# Patient Record
Sex: Male | Born: 1975 | Race: White | Hispanic: No | Marital: Married | Smoking: Current every day smoker
Health system: Southern US, Community
[De-identification: ages and names within clinical notes are randomized; demographics above are authoritative.]

## PROBLEM LIST (undated history)

## (undated) HISTORY — PX: BACK SURGERY: SHX140

---

## 2001-05-29 ENCOUNTER — Encounter: Payer: Self-pay | Admitting: Emergency Medicine

## 2001-05-29 ENCOUNTER — Emergency Department (HOSPITAL_COMMUNITY): Admission: EM | Admit: 2001-05-29 | Discharge: 2001-05-29 | Payer: Self-pay | Admitting: Emergency Medicine

## 2001-10-05 ENCOUNTER — Encounter: Payer: Self-pay | Admitting: Family Medicine

## 2001-10-05 ENCOUNTER — Encounter: Admission: RE | Admit: 2001-10-05 | Discharge: 2001-10-05 | Payer: Self-pay | Admitting: Family Medicine

## 2005-01-06 ENCOUNTER — Ambulatory Visit (HOSPITAL_COMMUNITY): Admission: RE | Admit: 2005-01-06 | Discharge: 2005-01-06 | Payer: Self-pay | Admitting: Orthopaedic Surgery

## 2007-12-05 ENCOUNTER — Ambulatory Visit (HOSPITAL_COMMUNITY): Admission: RE | Admit: 2007-12-05 | Discharge: 2007-12-06 | Payer: Self-pay | Admitting: Neurosurgery

## 2011-04-12 NOTE — Op Note (Signed)
NAMENORIS, KULINSKI NO.:  [id number]   MEDICAL RECORD NO.:  [id number]          PATIENT TYPE:  OIB   LOCATION:  3535                         FACILITY:  [facility]   PHYSICIAN:  Cristi Loron, M.D.DATE OF BIRTH:  09/23/1976   DATE OF PROCEDURE:  12/05/2007  DATE OF DISCHARGE:                               OPERATIVE REPORT   BRIEF HISTORY:  The patient is a 35 year old white male who has suffered  a prior left L5-S1 herniated disc after a work-related injury.  The  patient underwent a left L5-S1 microdiscectomy by another physician.  The patient has had recurrent back and leg pain and was worked up with a  lumbar MRI which demonstrated herniated disc at L5-S1 on the left.  The  patient's signs, symptoms, and physical exam are consistent with left S1  radiculopathy.  I discussed the various treatment options with the  patient including surgery.  He has weighed the risks, benefits, and  alternatives of surgery and decided to proceed with a left L5-S1 redo  microdiscectomy.   PREOPERATIVE DIAGNOSES:  Recurrent left L5-S1 herniated nucleus  pulposus, spinal stenosis, disc degeneration, lumbar radiculopathy, and  lumbago.   POSTOPERATIVE DIAGNOSES:  Recurrent left L5-S1 herniated nucleus  pulposus, spinal stenosis, disc degeneration, lumbar radiculopathy, and  lumbago.   PROCEDURE:  Redo left L5-S1 microdiscectomy using microdissection.   SURGEON:  Cristi Loron, MD   ASSISTANT:  Coletta Memos, MD   ANESTHESIA:  General endotracheal.   ESTIMATED BLOOD LOSS:  50 mL.   SPECIMENS:  None.   DRAINS:  None.   COMPLICATIONS:  None.   DESCRIPTION OF PROCEDURE:  The patient was brought to the operating room  by the anesthesia team.  General endotracheal anesthesia was induced.  The patient was then turned to the prone position on the Wilson frame.  His lumbosacral region was then prepared with Betadine scrub and  Betadine solution.  Sterile drapes were  applied.  I then injected the  area to be incised with Marcaine with epinephrine solution, and I used a  scalpel to make a linear midline incision through the patient's previous  surgical scar.  Of note, the incision was somewhat skewed, but I went  through the previous scar nonetheless.  I used electrocautery to perform  a left-sided subperiosteal dissection exposing the left spinous process  and lamina of L4-L5 and the upper sacrum.  We obtained intraoperative  radiograph to confirm our location and then inserted a McCullough  retractor for exposure and used a high-speed drill to drill through some  epidural fibrosis and to expose the remainder left L5 lamina.  I  extended the patient's prior left L5 laminotomy in a cephalad direction  until I encountered some relatively non-scarred dura.  I then used  microdissection to free up the dura from the overlying epidural tissue  as well as epidural fibrosis.  I then performed foraminotomy about the  left thecal sac and left S1 nerve root.  I removed the cephalad aspect  of the left S1 lamina as well to further decompress the nerve.  We used  microdissection to free up the thecal sac and the left S1 nerve root  from the underlying disc herniation.  I then incised the disc herniation  at L5-S1 on the left with a #15-blade scalpel, removed the disc  herniation and performed a partial intervertebral discectomy using the  Epstein and Scoville curettes as well as the pituitary forceps.  After  we were satisfied with the intervertebral discectomy, I used a high-  speed drill to remove some redundant ligament and spondylosis from the  vertebral endplates at L5-S1 further decompressing the neural  structures.  We then obtained hemostasis using bipolar electrocautery.  I palpated along the ventral surface of the thecal sac and along the  exit route of the left S1 nerve root and noted the neural structures  were well decompressed.  We irrigated the wound  out with bacitracin  solution, removed the retractor and then reapproximated the patient's  thoracolumbar fascia with interrupted #1 Vicryl suture, subcutaneous  tissue with interrupted 2-0 Vicryl suture, and the skin with Steri-  Strips and Benzoin.  The wound was then coated with bacitracin ointment  and sterile dressing was applied.  The drapes were removed.  The patient  was subsequently returned to the supine position where he was extubated  by the anesthesia team and transported to the post-anesthesia care unit  in a stable condition.  All sponge, instrument, and needle counts were  correct at the end of this case.      Cristi Loron, M.D.  Electronically Signed     JDJ/MEDQ  D:  12/05/2007  T:  12/06/2007  Job:  [id number]

## 2011-04-15 NOTE — H&P (Signed)
NAME:  Jeremy Contreras, Jeremy Contreras NO.:  [id number]   MEDICAL RECORD NO.:  [id number]          PATIENT TYPE:  OIB   LOCATION:  NA                           FACILITY:  [facility]   PHYSICIAN:  Sharolyn Douglas, M.D.        DATE OF BIRTH:  [birth year]   DATE OF ADMISSION:  DATE OF DISCHARGE:                                HISTORY & PHYSICAL   CHIEF COMPLAINT:  Pain in my back with weakness and numbness in my left leg  and foot.   HISTORY OF PRESENT ILLNESS:  This 35 year old male was injured when he was  climbing out of a hole.  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  He was treated at urgent care for a hamstring pull.  Eventually  he was referred to Dr. Candise Bowens office and MRI showed a large ruptured disk  at L5-S1.  The patient has continued with pain and discomfort in the left  buttock and thigh and calf and numbness as well.  Coughing, sneezing  markedly increases his pain and discomfort.  He has some relief of pain with  analgesics and muscle relaxants.  He has considerable amount of night  cramping into his leg.  He has had conservative treatment including anti-  inflammatories, pain medications, crutches, muscle relaxants, __________  therapy, which really has not helped his overall discomfort.  Due to the  findings on MRI and his failure to respond conservative treatment, it is  felt he would benefit from surgical intervention, being admitted for L5-S1  micro endoscopic diskectomy.  Dr. Noel Gerold has discussed the risks and benefits  of surgery.   PAST MEDICAL HISTORY:  The patient's family physician is Dr. Zenda Alpers at  [facility].   CURRENT MEDICATIONS:  Ibuprofen (will stop five days prior to surgery) and  Flexeril 10 mg q.8h. for muscle spasm.   ALLERGIES:  He has no medical allergies.   PAST SURGICAL HISTORY:  He has no previous surgeries and denies any medical  problems.   SOCIAL HISTORY:  The patient is single, engaged.  He is  an irrigation  foreman.  Smokes about a half pack of cigarettes per day.  No intake of  alcohol products.  Has one child and his fiancee will be caregiver after  surgery.   FAMILY HISTORY:  Noncontributory.   REVIEW OF SYSTEMS:  CNS:  No seizures, shoulder paralysis, or double vision.  The patient does have weakness and dorsiflexion of right left foot and  hallux with sensory loss in the lower extremity in the L5 nerve root  distribution.  CARDIOVASCULAR:  No chest pain.  No angina.  No orthopnea.  GASTROINTESTINAL:  No nausea, vomiting, melena, bloody stools.  GENITOURINARY:  No discharge, dysuria, hematuria.  MUSCULOSKELETAL:  Primarily in present illness.   PHYSICAL EXAMINATION:  GENERAL:  Alert, cooperative, friendly 35 year old  male whose vital signs are blood pressure 144/70, pulse 88, respirations 12.  HEENT:  Normocephalic.  PERRLA.  EOM intact.  Oropharynx is clear.  CHEST:  Clear to auscultation.  No rhonchi.  No rales.  No wheezes.  HEART:  Regular rate and rhythm.  No murmurs are heard.  ABDOMEN:  Soft, nontender.  Liver, spleen not felt.  GENITALIA:  Not done.  Not pertinent to present illness.  RECTAL:  Not done.  Not pertinent to present illness.  EXTREMITIES:  As in present illness above with positive straight leg raise  on the left and weakness and numbness.   ADMITTING DIAGNOSES:  Herniated nucleus pulposus L5-S1.   PLAN:  The patient will undergo L5-S1 micro endoscopic diskectomy.  This  history and physical was performed in our office on December 31, 2004.  Today  the patient is fitted with a lumbosacral orthosis which will be used  postoperatively.      DLU/MEDQ  D:  12/31/2004  T:  12/31/2004  Job:  [id number]   cc:   Zenda Alpers, M.D.  [facility]

## 2011-04-15 NOTE — Op Note (Signed)
NAMEKARIN, GRIFFITH NO.:  [id number]   MEDICAL RECORD NO.:  [id number]          PATIENT TYPE:  OIB   LOCATION:  2853                         FACILITY:  [facility]   PHYSICIAN:  Sharolyn Douglas, M.D.        DATE OF BIRTH:  Jun 17, 1976   DATE OF PROCEDURE:  01/06/2005  DATE OF DISCHARGE:                                 OPERATIVE REPORT   DIAGNOSIS:  L5-S1 disk herniation with left S1 radiculopathy.   PROCEDURE:  L5-S1 micro endoscopic diskectomy using the Max S retractor  system.   SURGEON:  Sharolyn Douglas, M.D.   ASSISTANT:  Verlin Fester, P.A.   ANESTHESIA:  General endotracheal.   COMPLICATIONS:  None.   INDICATIONS:  The patient is a pleasant 35 year old male with a large disk  rupture at L5-S1 and left S1 radiculopathy. He has elected to undergo L5-S1  micro endoscopic diskectomy in hopes of improving his symptoms. He knows the  risks and benefits.   PROCEDURE:  The patient was properly identified in the holding area, taken  to the operating room, underwent general endotracheal anesthesia without  difficulty. Given prophylactic IV antibiotics. Carefully turned prone onto  the Wilson frame. All bony prominences padded. Face and eyes protected at  all times. Back prepped, draped in the usual sterile fashion. Fluoroscopy  was brought into the field. The L5-S1 level was identified. A 2 cm incision  was made just the left of midline. Deep fascia incised. Dilators were then  used to spread the paraspinal muscle docking on the L5-S1 interspace using  fluoroscopy. We then placed the Max S retractor with a 60 mm blades and  expanded the retractor. We then attached the Max S retractor to the  operating room table using the attachment arm. Fluoroscopy confirmed good  positioning. We then removed a small thin layer of paraspinal muscle over  the L5-S1 interspace using pituitaries. The microscope was draped and  brought into the field. The inferior one third of the L5 lamina  removed with  a high-speed bur. Ligamentum flavum removed piecemeal. We immediately noted  that the S1 nerve root was being displaced dorsally by large disk rupture.  The nerve root was mobilized medially. The annulus was incised and the disk  rupture removed using a pituitary rongeur. We then entered the disk space  and removed several more small fragments. The spinal canal was explored down  medial to the S1 pedicle using a blunt probe and we did not appreciate any  additional fragments. The wound was irrigated. 2 mL of fentanyl left over  the exposed  epidural space. The Max S retractor was removed and the deep fascia closed  with an O Vicryl followed by 2-0 Vicryl in subcutaneous layer and a layer of  Dermabond on the epidermis. The patient was then turned supine, extubated  without difficulty and transferred to recovery in stable condition.      MC/MEDQ  D:  01/06/2005  T:  01/06/2005  Job:  [id number]

## 2011-04-21 ENCOUNTER — Emergency Department (HOSPITAL_BASED_OUTPATIENT_CLINIC_OR_DEPARTMENT_OTHER)
Admission: EM | Admit: 2011-04-21 | Discharge: 2011-04-22 | Disposition: A | Discharge status: Home / Self Care | Payer: Self-pay | Attending: Emergency Medicine | Admitting: Emergency Medicine

## 2011-04-21 ENCOUNTER — Emergency Department (INDEPENDENT_AMBULATORY_CARE_PROVIDER_SITE_OTHER): Payer: Self-pay

## 2011-04-21 DIAGNOSIS — W208XXA Other cause of strike by thrown, projected or falling object, initial encounter: Secondary | ICD-10-CM | POA: Insufficient documentation

## 2011-04-21 DIAGNOSIS — S9030XA Contusion of unspecified foot, initial encounter: Secondary | ICD-10-CM | POA: Insufficient documentation

## 2011-04-21 DIAGNOSIS — F17200 Nicotine dependence, unspecified, uncomplicated: Secondary | ICD-10-CM | POA: Insufficient documentation

## 2011-04-21 DIAGNOSIS — G8929 Other chronic pain: Secondary | ICD-10-CM | POA: Insufficient documentation

## 2011-08-17 LAB — BASIC METABOLIC PANEL
BUN: 7
CO2: 25
Calcium: 9.6
Chloride: 106
Creatinine, Ser: 0.87
GFR calc Af Amer: >60
GFR calc non Af Amer: >60
Glucose, Bld: 104 — ABNORMAL HIGH
Potassium: 4.3
Sodium: 137

## 2011-08-17 LAB — CBC
MCHC: 35.1
MCV: 88.6
RBC: 4.99
RDW: 13

## 2013-05-30 ENCOUNTER — Ambulatory Visit (INDEPENDENT_AMBULATORY_CARE_PROVIDER_SITE_OTHER): Payer: BC Managed Care – PPO | Admitting: Physician Assistant

## 2013-05-30 ENCOUNTER — Ambulatory Visit (INDEPENDENT_AMBULATORY_CARE_PROVIDER_SITE_OTHER): Payer: BC Managed Care – PPO

## 2013-05-30 ENCOUNTER — Encounter: Payer: Self-pay | Admitting: Physician Assistant

## 2013-05-30 VITALS — BP 129/76 | HR 59 | Temp 98.9°F | Ht 69.0 in | Wt 190.0 lb

## 2013-05-30 DIAGNOSIS — M549 Dorsalgia, unspecified: Secondary | ICD-10-CM

## 2013-05-30 DIAGNOSIS — G8929 Other chronic pain: Secondary | ICD-10-CM | POA: Insufficient documentation

## 2013-05-30 DIAGNOSIS — M25561 Pain in right knee: Secondary | ICD-10-CM

## 2013-05-30 DIAGNOSIS — M25569 Pain in unspecified knee: Secondary | ICD-10-CM

## 2013-05-30 DIAGNOSIS — B019 Varicella without complication: Secondary | ICD-10-CM

## 2013-05-30 MED ORDER — VALACYCLOVIR HCL 1 G PO TABS: 1000.0000 mg | ORAL_TABLET | Freq: Three times a day (TID) | ORAL | Status: DC

## 2013-05-30 NOTE — Patient Instructions (Signed)
Chickenpox in Adults Chickenpox is an illness caused by a virus. This virus can spread easily from one person to another. Those with chickenpox almost never get it more than once. While it usually strikes children, adults who have never had the illness or vaccine can get chickenpox. In children, the illness is reasonably mild, although annoying. In adults, it can be very serious. CAUSES  A varicella-zoster virus causes chickenpox. The virus is passed in tiny droplets that the infected person coughs or sneezes into the air. Chickenpox can also be passed when someone comes into contact with the fluid produced by the chickenpox rash. When someone has been exposed to chickenpox, he or she usually comes down with the illness within about 10 to 21 days.  SYMPTOMS  The illness usually starts with:  Body aches and pain.  Headache.  Irritability.  Tiredness.  Fever.  Sore throat. A day or two later, a rash develops. The rash is made up of very itchy blisters. The rash lasts about 5 to 7 days. Each "chicken pock" heals over with a crusty scab. Chickenpox is very serious illness in adults. There is a higher risk of complications, including:  Pneumonia.  Skin infection.  Bone infection (osteomyelitis).  Joint infection (septic arthritis).  Brain infection (encephalitis).  Toxic shock syndrome.  Bleeding problems.  Problems with balance and muscle control (cerebellar ataxia).  Death. Women who get chickenpox during pregnancy have a higher risk of having a baby with birth defects. DIAGNOSIS  A diagnosis is based on the presence of the usual symptoms of achiness and fever along with the characteristic rash. If there is any question, blood tests can be done to diagnose the infection. TREATMENT  Treatment for chickenpox may include:  Taking the anti-viral drug acyclovir to shorten the length of the illness and to decrease its severity. The drug has to be started within 24 hours of  symptoms.  Taking medicine as directed by your caregiver.  Applying calamine lotion to decrease itchiness.  Baking soda or oatmeal baths to soothe itchy skin. When a person who has never had chickenpox has been exposed to the virus (especially a pregnant woman or a patient with HIV or AIDS) they might benefit from a shot of varicella-zoster immune globulin (VZIG). This helps prevent the person from actually coming down with the illness. VZIG must be given within 72 hours of exposure to the virus. HOME CARE INSTRUCTIONS   Only take over-the-counter or prescriptions medicines for pain, discomfort, or fever as directed by your caregiver.  Try taking a lukewarm (not hot) bath every few hours. Adding several tablespoons of baking soda or oatmeal may help make the bath more soothing.  Ice packs or cold washcloths applied to the rash may help improve itching.  Ask your caregiver if you may use an over-the-counter antihistamine (such as diphenhydramine) to decrease itching.  Wash your hands often. This helps lower the risk of a bacterial skin infection, as well as passing the virus to others. If you can, use alcohol-based rubs or wipes. If you cannot get these, use regular soap and water.  If you have blisters in your mouth, do not eat or drink spicy, salty, or acidic things. Soft, bland, cold foods and beverages will feel best.  Avoid people who have not had chickenpox or women who are pregnant. SEEK IMMEDIATE MEDICAL CARE IF:   You have a hard time breathing.  You have a severe headache.  You have a stiff neck.  You have severe   joint pain or stiffness.  You feel disoriented or confused.  You are having trouble walking or keeping your balance.  You have an oral temperature above 102 F (38.9 C).  The area around one of the chickenpox becomes very red, hot to the touch, painful, or leaks pus. Document Released: 08/23/2008 Document Revised: 02/06/2012 Document Reviewed:  08/23/2008 [employer] Patient Information 2014 [employer].  

## 2013-05-30 NOTE — Progress Notes (Signed)
Subjective:     Patient ID: Jeremy Contreras, male   DOB: 02-03-1976, 37 y.o.   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 pt and Pregnant women RX for patellar stab. brace F/U prn

## 2014-04-12 ENCOUNTER — Ambulatory Visit: Payer: BC Managed Care – PPO | Admitting: Nurse Practitioner

## 2014-04-12 ENCOUNTER — Encounter: Payer: Self-pay | Admitting: Nurse Practitioner

## 2014-04-12 VITALS — BP 141/81 | HR 78 | Temp 97.5°F | Ht 69.0 in | Wt 185.0 lb

## 2014-04-12 DIAGNOSIS — A5131 Condyloma latum: Secondary | ICD-10-CM

## 2014-04-12 DIAGNOSIS — A5139 Other secondary syphilis of skin: Secondary | ICD-10-CM

## 2014-04-12 MED ORDER — PODOFILOX 0.5 % EX GEL: Freq: Two times a day (BID) | CUTANEOUS | Status: DC

## 2014-04-12 NOTE — Patient Instructions (Signed)
Podofilox topical gel What is this medicine? PODOFILOX (po do FIL ox) is used to remove genital or perianal warts. This medicine may be used for other purposes; ask your health care provider or pharmacist if you have questions. COMMON BRAND NAME(S): Condylox What should I tell my health care provider before I take this medicine? They need to know if you have any of these conditions: -an unusual or allergic reaction to podofilox, podophyllum resin, other medicines, foods, dyes or preservatives -pregnant or trying to get pregnant -breast-feeding How should I use this medicine? This medicine is for external use only. Do not take by mouth. This medicine may be used to remove warts on areas of the skin around the vagina or penis and between the rectum and the genitals. It should not be used to treat warts that are inside the rectum, vagina, or penis. Follow the directions on the prescription label. Wash hands before and after use. Apply the gel to the specific wart as instructed by your doctor or health care professional. Use the applicator provided or a cotton-tipped applicator. Applicators should not be re-used. Make sure that the gel is dry before normal, untreated skin comes into contact with the treated skin. This medicine can cause severe irritation of normal skin. If contact with normal skin occurs, immediately flush the area thoroughly with water. Avoid contact with the eyes. If eye contact occurs, immediately flush the eye with large quantities of water for 15 minutes and seek medical attention. Do not use this medicine more often or for longer than directed. Talk to your pediatrician regarding the use of this medicine in children. Special care may be needed. Overdosage: If you think you have taken too much of this medicine contact a poison control center or emergency room at once. NOTE: This medicine is only for you. Do not share this medicine with others. What if I miss a dose? If you miss a  dose, use it as soon as you can. If it is almost time for your next dose, use only that dose. Do not use double or extra doses. What may interact with this medicine? Interactions are not expected. Do not use any other skin products on the same area of skin without asking your doctor or health care professional. This list may not describe all possible interactions. Give your health care provider a list of all the medicines, herbs, non-prescription drugs, or dietary supplements you use. Also tell them if you smoke, drink alcohol, or use illegal drugs. Some items may interact with your medicine. What should I watch for while using this medicine? Visit your doctor or health care professional for regular checks on your progress. This medicine is not a cure. New warts may develop during or after treatment. Tell your doctor or health care professional if your symptoms do not start to get better within one week. The weekly treatment course can be repeated up to 4 times. If the wart does not go away in 4 weeks, a different treatment should be considered. If you are pregnant or think you might be pregnant, contact your doctor or health care professional. Sexual (genital, oral, anal) contact should be avoided while the medication is on the skin. The only way to prevent infecting others with the HPV virus (the virus that causes genital warts) is to avoid direct skin-to-skin contact. If warts are visible in the genital area, sexual contact should be avoided until the warts are treated. Experts advise that using latex condoms during sexual contact  may reduce, but not entirely prevent, infecting others. What side effects may I notice from receiving this medicine? Side effects that you should report to your doctor or health care professional as soon as possible: -allergic reactions like skin rash, itching or hives, swelling of the face, lips, or tongue -bleeding, blistering, burning, crusting, or scabbing of treated  skin -blood in the urine -dizziness -vomiting (may indicate excessive dosage) Side effects that usually do not require medical attention (report to your doctor or health care professional if they continue or are bothersome): -dryness, flaking or peeling of the skin -headache -mild redness, itching or stinging of the skin This list may not describe all possible side effects. Call your doctor for medical advice about side effects. You may report side effects to FDA at 1-800-FDA-1088. Where should I keep my medicine? Keep out of the reach of children. Store at room temperature between 15 and 30 degrees C (59 and 86 degrees F). Do not freeze. Keep container tightly closed. This medicine contains alcohol and is flammable. Do not store near heat or open flame. Throw away any unused medicine after the expiration date. NOTE: This sheet is a summary. It may not cover all possible information. If you have questions about this medicine, talk to your doctor, pharmacist, or health care provider.  2014, Elsevier/Gold Standard. (2008-06-16 16:12:59) Genital Warts Genital warts are a sexually transmitted infection. They may appear as small bumps on the tissues of the genital area. CAUSES  Genital warts are caused by a virus called human papillomavirus (HPV). HPV is the most common sexually transmitted disease (STD) and infection of the sex organs. This infection is spread by having unprotected sex with an infected person. It can be spread by vaginal, anal, and oral sex. Many people do not know they are infected. They may be infected for years without problems. However, even if they do not have problems, they can unknowingly pass the infection to their sexual partners. SYMPTOMS   Itching and irritation in the genital area.  Warts that bleed.  Painful sexual intercourse. DIAGNOSIS  Warts are usually recognized with the naked eye on the vagina, vulva, perineum, anus, and rectum. Certain tests can also  diagnose genital warts, such as:  A Pap test.  A tissue sample (biopsy) exam.  Colposcopy. A magnifying tool is used to examine the vagina and cervix. The HPV cells will change color when certain solutions are used. TREATMENT  Warts can be removed by:  Applying certain chemicals, such as cantharidin or podophyllin.  Liquid nitrogen freezing (cryotherapy).  Immunotherapy with candida or trichophyton injections.  Laser treatment.  Burning with an electrified probe (electrocautery).  Interferon injections.  Surgery. PREVENTION  HPV vaccination can help prevent HPV infections that cause genital warts and that cause cancer of the cervix. It is recommended that the vaccination be given to people between the ages 829 to 39 years old. The vaccine might not work as well or might not work at all if you already have HPV. It should not be given to pregnant women. HOME CARE INSTRUCTIONS   It is important to follow your caregiver's instructions. The warts will not go away without treatment. Repeat treatments are often needed to get rid of warts. Even after it appears that the warts are gone, the normal tissue underneath often remains infected.  Do not try to treat genital warts with medicine used to treat hand warts. This type of medicine is strong and can burn the skin in the genital area,  causing more damage.  Tell your past and current sexual partner(s) that you have genital warts. They may be infected also and need treatment.  Avoid sexual contact while being treated.  Do not touch or scratch the warts. The infection may spread to other parts of your body.  Women with genital warts should have a cervical cancer check (Pap test) at least once a year. This type of cancer is slow-growing and can be cured if found early. Chances of developing cervical cancer are increased with HPV.  Inform your obstetrician about your warts in the event of pregnancy. This virus can be passed to the baby's  respiratory tract. Discuss this with your caregiver.  Use a condom during sexual intercourse. Following treatment, the use of condoms will help prevent reinfection.  Ask your caregiver about using over-the-counter anti-itch creams. SEEK MEDICAL CARE IF:   Your treated skin becomes red, swollen, or painful.  You have a fever.  You feel generally ill.  You feel little lumps in and around your genital area.  You are bleeding or have painful sexual intercourse. MAKE SURE YOU:   Understand these instructions.  Will watch your condition.  Will get help right away if you are not doing well or get worse. Document Released: 11/11/2000 Document Revised: 02/06/2012 Document Reviewed: 05/23/2011 [facility] Patient Information 2014 [place], [place].

## 2014-04-12 NOTE — Progress Notes (Signed)
   Subjective:    Patient ID: Jeremy Contreras, male    DOB: 1976-04-10, 38 y.o.   MRN: [id number]  HPI Patient has bumps in private area- started Tuesday- He works in heating and air conditioning and was under a hot hose for several hours and noticed that testicles were itching and when he got home he saw some bumps in private area.    Review of Systems     Objective:   Physical Exam  Constitutional: He is oriented to person, place, and time. He appears well-developed and well-nourished.  Cardiovascular: Normal rate, regular rhythm and normal heart sounds.   Pulmonary/Chest: Effort normal and breath sounds normal.  Genitourinary:  Flesh colored wart like lesions on penile shaft.  Neurological: He is alert and oriented to person, place, and time.  Skin: Skin is warm and dry.  Psychiatric: He has a normal mood and affect. His behavior is normal. Judgment and thought content normal.    BP 141/81  Pulse 78  Temp(Src) 97.5 F (36.4 C) (Oral)  Ht 5\' 9"  (1.753 m)  Wt 185 lb (83.915 kg)  BMI 27.31 kg/m2       Assessment & Plan:   1. Condylomata lata of penis    Meds ordered this encounter  Medications  . podofilox (CONDYLOX) 0.5 % gel    Sig: Apply topically 2 (two) times daily.    Dispense:  3.5 g    Refill:  0    Order Specific Question:  Supervising Provider    Answer:  Ernestina PennaMOORE, DONALD W [[id number]   Safe sex discussed Follow up if not improving  Mary-Margaret Daphine DeutscherMartin, FNP

## 2014-05-07 ENCOUNTER — Other Ambulatory Visit: Payer: Self-pay | Admitting: Nurse Practitioner

## 2014-07-24 ENCOUNTER — Other Ambulatory Visit: Payer: Self-pay | Admitting: Nurse Practitioner

## 2014-07-28 NOTE — Telephone Encounter (Signed)
Last ov 5/15. 

## 2014-12-31 ENCOUNTER — Other Ambulatory Visit: Payer: Self-pay | Admitting: Nurse Practitioner

## 2015-02-24 ENCOUNTER — Other Ambulatory Visit: Payer: Self-pay | Admitting: Nurse Practitioner

## 2015-03-04 ENCOUNTER — Other Ambulatory Visit: Payer: Self-pay | Admitting: Nurse Practitioner

## 2015-03-11 ENCOUNTER — Other Ambulatory Visit: Payer: Self-pay | Admitting: Nurse Practitioner

## 2015-05-26 ENCOUNTER — Ambulatory Visit (INDEPENDENT_AMBULATORY_CARE_PROVIDER_SITE_OTHER): Payer: Self-pay | Admitting: Physician Assistant

## 2015-05-26 ENCOUNTER — Encounter: Payer: Self-pay | Admitting: Physician Assistant

## 2015-05-26 VITALS — BP 157/92 | HR 62 | Temp 96.8°F | Ht 69.0 in | Wt 186.0 lb

## 2015-05-26 DIAGNOSIS — B356 Tinea cruris: Secondary | ICD-10-CM

## 2015-05-26 DIAGNOSIS — B86 Scabies: Secondary | ICD-10-CM

## 2015-05-26 DIAGNOSIS — B372 Candidiasis of skin and nail: Secondary | ICD-10-CM

## 2015-05-26 MED ORDER — PERMETHRIN 5 % EX CREA: TOPICAL_CREAM | CUTANEOUS | Status: DC

## 2015-05-26 MED ORDER — FLUCONAZOLE 150 MG PO TABS: ORAL_TABLET | ORAL | Status: DC

## 2015-05-26 MED ORDER — SULFAMETHOXAZOLE-TRIMETHOPRIM 800-160 MG PO TABS: 1.0000 | ORAL_TABLET | Freq: Two times a day (BID) | ORAL | Status: DC

## 2015-05-26 NOTE — Patient Instructions (Signed)
Get Zeasorb powders- Apply zeasorb powders to areas of groin and buttocks after THOROUGHLY DRYING AREA AFTER BATHING. Change to [employer] only   Scabies Scabies are small bugs (mites) that burrow under the skin and cause red bumps and severe itching. These bugs can only be seen with a microscope. Scabies are highly contagious. They can spread easily from person to person by direct contact. They are also spread through sharing clothing or linens that have the scabies mites living in them. It is not unusual for an entire family to become infected through shared towels, clothing, or bedding.  HOME CARE INSTRUCTIONS   Your caregiver may prescribe a cream or lotion to kill the mites. If cream is prescribed, massage the cream into the entire body from the neck to the bottom of both feet. Also massage the cream into the scalp and face if your child is less than 79 year old. Avoid the eyes and mouth. Do not wash your hands after application.  Leave the cream on for 8 to 12 hours. Your child should bathe or shower after the 8 to 12 hour application period. Sometimes it is helpful to apply the cream to your child right before bedtime.  One treatment is usually effective and will eliminate approximately 95% of infestations. For severe cases, your caregiver may decide to repeat the treatment in 1 week. Everyone in your household should be treated with one application of the cream.  New rashes or burrows should not appear within 24 to 48 hours after successful treatment. However, the itching and rash may last for 2 to 4 weeks after successful treatment. Your caregiver may prescribe a medicine to help with the itching or to help the rash go away more quickly.  Scabies can live on clothing or linens for up to 3 days. All of your child's recently used clothing, towels, stuffed toys, and bed linens should be washed in hot water and then dried in a dryer for at least 20 minutes on high heat. Items that cannot be washed  should be enclosed in a plastic bag for at least 3 days.  To help relieve itching, bathe your child in a cool bath or apply cool washcloths to the affected areas.  Your child may return to school after treatment with the prescribed cream. SEEK MEDICAL CARE IF:   The itching persists longer than 4 weeks after treatment.  The rash spreads or becomes infected. Signs of infection include red blisters or yellow-tan crust. Document Released: 11/14/2005 Document Revised: 02/06/2012 Document Reviewed: 03/25/2009 [facility] Patient Information 2015 [place], [place]. This information is not intended to replace advice given to you by your health care provider. Make sure you discuss any questions you have with your health care provider.  Jock Itch Jock itch is a fungal infection of the skin in the groin area. It is sometimes called "ringworm" even though it is not caused by a worm. A fungus is a type of germ that thrives in dark, damp places.  CAUSES  This infection may spread from:  A fungus infection elsewhere on the body (such as athlete's foot).  Sharing towels or clothing. This infection is more common in:  Hot, humid climates.  People who wear tight-fitting clothing or wet bathing suits for long periods of time.  Athletes.  Overweight people.  People with diabetes. SYMPTOMS  Jock itch causes the following symptoms:  Red, pink or brown rash in the groin. Rash may spread to the thighs, anus, and buttocks.  Itching.  DIAGNOSIS  Your caregiver may make the diagnosis by looking at the rash. Sometimes a skin scraping will be sent to test for fungus. Testing can be done either by looking under the microscope or by doing a culture (test to try to grow the fungus). A culture can take up to 2 weeks to come back. TREATMENT  Jock itch may be treated with:  Skin cream or ointment to kill fungus.  Medicine by mouth to kill fungus.  Skin cream or ointment to calm the itching.  Compresses or  medicated powders to dry the infected skin. HOME CARE INSTRUCTIONS   Be sure to treat the rash completely. Follow your caregiver's instructions. It can take a couple of weeks to treat. If you do not treat the infection long enough, the rash can come back.  Wear loose-fitting clothing.  Men should wear cotton boxer shorts.  Women should wear cotton underwear.  Avoid hot baths.  Dry the groin area well after bathing. SEEK MEDICAL CARE IF:   Your rash is worse.  Your rash is spreading.  Your rash returns after treatment is finished.  Your rash is not gone in 4 weeks. Fungal infections are slow to respond to treatment. Some redness may remain for several weeks after the fungus is gone. SEEK IMMEDIATE MEDICAL CARE IF:  The area becomes red, warm, tender, and swollen.  You have a fever. Document Released: 11/04/2002 Document Revised: 02/06/2012 Document Reviewed: 10/03/2008 [facility] Patient Information 2015 [place], [place]. This information is not intended to replace advice given to you by your health care provider. Make sure you discuss any questions you have with your health care provider.

## 2015-05-26 NOTE — Progress Notes (Signed)
   Subjective:    Patient ID: Jeremy Contreras, male    DOB: Jan 07, 1976, 39 y.o.   MRN: [id number]  HPI 39 y/o male presents with c/o rash on scalp, left axilla and bilateral groin, also intergluteal. Intermittent. Has tried Desitin with no relief. Itches, burning. Itches worse at night.     Review of Systems  Skin: Positive for rash (left arm pit, bilateral groin and between buttocks x 3 weeks. , intermittent).       Objective:   Physical Exam  Constitutional: He appears well-developed and well-nourished.  Skin:  Erythematous papules on buttocks, trunk, BLE and groin resembling scabies infection.   Erythematous abrasion, "raw" appearing area between buttocks and groin  Dry scaling plaques with raised borders on left axilla and LUE resembling eczema or tinea          Assessment & Plan:  1. Scabies  - permethrin (ELIMITE) 5 % cream; Apply neck down at bedtime. Wash off in morning  Dispense: 60 g; Refill: 1  2. Tinea cruris  - fluconazole (DIFLUCAN) 150 MG tablet; Take 1 pill every 3 days until finished  Dispense: 3 tablet; Refill: 1  3. Candidiasis, intertrigo  - sulfamethoxazole-trimethoprim (BACTRIM DS,SEPTRA DS) 800-160 MG per tablet; Take 1 tablet by mouth 2 (two) times daily.  Dispense: 20 tablet; Refill: 0   Change to [employer] soap only  Use Zeasorb powders daily in groin and intergluteal   Alberta Lenhard A. Chauncey ReadingGann PA-C

## 2015-09-24 ENCOUNTER — Ambulatory Visit (INDEPENDENT_AMBULATORY_CARE_PROVIDER_SITE_OTHER): Payer: Managed Care, Other (non HMO) | Admitting: Family Medicine

## 2015-09-24 VITALS — BP 129/72 | HR 71 | Temp 98.4°F | Ht 69.0 in | Wt 187.0 lb

## 2015-09-24 DIAGNOSIS — F39 Unspecified mood [affective] disorder: Secondary | ICD-10-CM | POA: Insufficient documentation

## 2015-09-24 MED ORDER — FLUOXETINE HCL 20 MG PO TABS: 20.0000 mg | ORAL_TABLET | Freq: Every day | ORAL | Status: DC

## 2015-09-24 NOTE — Progress Notes (Signed)
   HPI  Patient presents today here for evaluation of anger outbursts  Patient explains that over the last year or so he's been having more problems with his anger management. He says "I have less tolerance for stupid people than I used to". Over the last month he has had 3 different episodes where he had angry outbursts that ended up with him yelling and insulting people at work. Today his boss sent him home, he previously already told him to get evaluated at the doctor's office.  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  He feels that his reactions are inappropriate and the magnitude of them are greater than they need to be.  Patient explains that her last year or so he's had worsening  PMH: Smoking status noted ROS: Per HPI  Objective: BP 129/72 mmHg  Pulse 71  Temp(Src) 98.4 F (36.9 C) (Oral)  Ht 5\' 9"  (1.753 m)  Wt 187 lb (84.823 kg)  BMI 27.60 kg/m2 Gen: NAD, alert, cooperative with exam HEENT: NCAT Ext: No edema, warm Neuro: Alert and oriented, No gross deficits Psych: normal mood and affect, denies SI  Assessment and plan:  #Mood disorder Possible intermittent explosive disorder, however he does not seem to be quite that severe. Trial of fluoxetine Also discussed counseling which she will consider but does not want to pursue yet. Discussed close follow-up, 2-3 weeks   Meds ordered this encounter  Medications  . FLUoxetine (PROZAC) 20 MG tablet    Sig: Take 1 tablet (20 mg total) by mouth daily.    Dispense:  30 tablet    Refill:  3    Murtis SinkSam Dayleen Beske, MD [facility] 09/24/2015, 5:47 PM

## 2015-09-24 NOTE — Patient Instructions (Signed)
Great to meet you!  Come back in 2-3 weeks  Taking the medicine as directed and not missing any doses is one of the best things you can do to treat your angry outbursts.  Here are some things to keep in mind:  1) Side effects (stomach upset, some increased anxiety) may happen before you notice a benefit.  These side effects typically go away over time. 2) Changes to your dose of medicine or a change in medication all together is sometimes necessary 3) Most people need to be on medication at least 6-12 months 4) Many people will notice an improvement within two weeks but the full effect of the medication can take up to 4-6 weeks 5) Stopping the medication when you start feeling better often results in a return of symptoms 6) If you start having thoughts of hurting yourself or others after starting this medicine, please call me

## 2015-09-25 ENCOUNTER — Telehealth: Payer: Self-pay | Admitting: Family Medicine

## 2015-09-25 MED ORDER — ESCITALOPRAM OXALATE 10 MG PO TABS: 10.0000 mg | ORAL_TABLET | Freq: Every day | ORAL | Status: DC

## 2015-09-25 NOTE — Addendum Note (Signed)
Addended by: Elenora GammaBRADSHAW, SAMUEL L on: 09/25/2015 04:38 PM   Modules accepted: Orders, Medications

## 2015-09-25 NOTE — Telephone Encounter (Signed)
Pt aware.

## 2015-09-25 NOTE — Telephone Encounter (Signed)
Ok to change, will try lexapro. Rx sent  Murtis SinkSam Bradshaw, MD [facility] 09/25/2015, 4:37 PM

## 2015-09-25 NOTE — Telephone Encounter (Signed)
Patient called stating that he did not want to take the Prozac and would like to try something else.  Message sent to Dr. Ermalinda MemosBradshaw

## 2015-10-12 ENCOUNTER — Ambulatory Visit (INDEPENDENT_AMBULATORY_CARE_PROVIDER_SITE_OTHER): Payer: Managed Care, Other (non HMO) | Admitting: Family Medicine

## 2015-10-12 ENCOUNTER — Encounter: Payer: Self-pay | Admitting: Family Medicine

## 2015-10-12 VITALS — BP 141/86 | HR 69 | Temp 97.1°F | Ht 69.0 in | Wt 191.1 lb

## 2015-10-12 DIAGNOSIS — L853 Xerosis cutis: Secondary | ICD-10-CM | POA: Diagnosis not present

## 2015-10-12 DIAGNOSIS — F39 Unspecified mood [affective] disorder: Secondary | ICD-10-CM | POA: Diagnosis not present

## 2015-10-12 DIAGNOSIS — Z113 Encounter for screening for infections with a predominantly sexual mode of transmission: Secondary | ICD-10-CM | POA: Diagnosis not present

## 2015-10-12 MED ORDER — HYDROCORTISONE 2.5 % EX OINT: TOPICAL_OINTMENT | Freq: Two times a day (BID) | CUTANEOUS | Status: AC

## 2015-10-12 NOTE — Patient Instructions (Signed)
Great to see you!  I am treating you for dry skin Shorten your showers Stop dial soap, try plain dove bar soap Use a good unscented lotion 1-2 times daily (aveeno, curel, CeraVe) Use the cream I have prescribed for about 1-2 weeks  We will call with your results within 1 week
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
## 2015-10-13 LAB — RPR: RPR Ser Ql: NONREACTIVE

## 2015-10-13 LAB — HIV ANTIBODY (ROUTINE TESTING W REFLEX): HIV SCREEN 4TH GENERATION: NONREACTIVE

## 2015-10-14 LAB — GC/CHLAMYDIA PROBE AMP
CHLAMYDIA, DNA PROBE: NEGATIVE
NEISSERIA GONORRHOEAE BY PCR: NEGATIVE

## 2015-10-27 ENCOUNTER — Telehealth: Payer: Self-pay | Admitting: Nurse Practitioner

## 2015-10-27 NOTE — Telephone Encounter (Signed)
Spoke to pt

## 2017-02-14 DIAGNOSIS — R030 Elevated blood-pressure reading, without diagnosis of hypertension: Secondary | ICD-10-CM | POA: Diagnosis not present

## 2017-02-14 DIAGNOSIS — M62838 Other muscle spasm: Secondary | ICD-10-CM | POA: Diagnosis not present

## 2017-02-14 DIAGNOSIS — M5416 Radiculopathy, lumbar region: Secondary | ICD-10-CM | POA: Diagnosis not present

## 2017-02-14 DIAGNOSIS — M545 Low back pain: Secondary | ICD-10-CM | POA: Diagnosis not present

## 2017-05-15 DIAGNOSIS — M62838 Other muscle spasm: Secondary | ICD-10-CM | POA: Diagnosis not present

## 2017-05-15 DIAGNOSIS — M5416 Radiculopathy, lumbar region: Secondary | ICD-10-CM | POA: Diagnosis not present

## 2017-05-15 DIAGNOSIS — I10 Essential (primary) hypertension: Secondary | ICD-10-CM | POA: Diagnosis not present

## 2017-05-15 DIAGNOSIS — M545 Low back pain: Secondary | ICD-10-CM | POA: Diagnosis not present

## 2017-08-10 DIAGNOSIS — M545 Low back pain: Secondary | ICD-10-CM | POA: Diagnosis not present

## 2017-08-10 DIAGNOSIS — R030 Elevated blood-pressure reading, without diagnosis of hypertension: Secondary | ICD-10-CM | POA: Diagnosis not present

## 2017-08-10 DIAGNOSIS — M62838 Other muscle spasm: Secondary | ICD-10-CM | POA: Diagnosis not present

## 2017-08-10 DIAGNOSIS — Z6826 Body mass index (BMI) 26.0-26.9, adult: Secondary | ICD-10-CM | POA: Diagnosis not present

## 2017-11-09 DIAGNOSIS — R030 Elevated blood-pressure reading, without diagnosis of hypertension: Secondary | ICD-10-CM | POA: Diagnosis not present

## 2017-11-09 DIAGNOSIS — M62838 Other muscle spasm: Secondary | ICD-10-CM | POA: Diagnosis not present

## 2017-11-09 DIAGNOSIS — Z6826 Body mass index (BMI) 26.0-26.9, adult: Secondary | ICD-10-CM | POA: Diagnosis not present

## 2017-11-09 DIAGNOSIS — M545 Low back pain: Secondary | ICD-10-CM | POA: Diagnosis not present

## 2018-01-30 DIAGNOSIS — M545 Low back pain: Secondary | ICD-10-CM | POA: Diagnosis not present

## 2018-01-30 DIAGNOSIS — M5416 Radiculopathy, lumbar region: Secondary | ICD-10-CM | POA: Diagnosis not present

## 2018-01-30 DIAGNOSIS — Z6827 Body mass index (BMI) 27.0-27.9, adult: Secondary | ICD-10-CM | POA: Diagnosis not present

## 2018-01-30 DIAGNOSIS — I10 Essential (primary) hypertension: Secondary | ICD-10-CM | POA: Diagnosis not present

## 2018-02-27 ENCOUNTER — Other Ambulatory Visit: Payer: Self-pay | Admitting: *Deleted

## 2018-02-27 DIAGNOSIS — M79645 Pain in left finger(s): Secondary | ICD-10-CM

## 2018-03-03 ENCOUNTER — Ambulatory Visit
Admission: RE | Admit: 2018-03-03 | Discharge: 2018-03-03 | Disposition: A | Discharge status: Home / Self Care | Payer: Worker's Compensation | Source: Ambulatory Visit | Attending: *Deleted | Admitting: *Deleted

## 2018-03-03 DIAGNOSIS — M79645 Pain in left finger(s): Secondary | ICD-10-CM

## 2018-04-18 DIAGNOSIS — M545 Low back pain: Secondary | ICD-10-CM | POA: Diagnosis not present

## 2018-04-18 DIAGNOSIS — M5416 Radiculopathy, lumbar region: Secondary | ICD-10-CM | POA: Diagnosis not present

## 2018-07-09 DIAGNOSIS — M5416 Radiculopathy, lumbar region: Secondary | ICD-10-CM | POA: Diagnosis not present

## 2018-07-09 DIAGNOSIS — M545 Low back pain: Secondary | ICD-10-CM | POA: Diagnosis not present

## 2018-07-09 DIAGNOSIS — R030 Elevated blood-pressure reading, without diagnosis of hypertension: Secondary | ICD-10-CM | POA: Diagnosis not present

## 2018-07-09 DIAGNOSIS — Z6828 Body mass index (BMI) 28.0-28.9, adult: Secondary | ICD-10-CM | POA: Diagnosis not present

## 2018-09-27 DIAGNOSIS — Z6829 Body mass index (BMI) 29.0-29.9, adult: Secondary | ICD-10-CM | POA: Diagnosis not present

## 2018-09-27 DIAGNOSIS — M5416 Radiculopathy, lumbar region: Secondary | ICD-10-CM | POA: Diagnosis not present

## 2018-09-27 DIAGNOSIS — I10 Essential (primary) hypertension: Secondary | ICD-10-CM | POA: Diagnosis not present

## 2018-09-27 DIAGNOSIS — M545 Low back pain: Secondary | ICD-10-CM | POA: Diagnosis not present

## 2019-01-23 DIAGNOSIS — Z6829 Body mass index (BMI) 29.0-29.9, adult: Secondary | ICD-10-CM | POA: Diagnosis not present

## 2019-01-23 DIAGNOSIS — R030 Elevated blood-pressure reading, without diagnosis of hypertension: Secondary | ICD-10-CM | POA: Diagnosis not present

## 2019-01-23 DIAGNOSIS — M5416 Radiculopathy, lumbar region: Secondary | ICD-10-CM | POA: Diagnosis not present

## 2019-01-23 DIAGNOSIS — M545 Low back pain: Secondary | ICD-10-CM | POA: Diagnosis not present

## 2019-04-25 DIAGNOSIS — M5136 Other intervertebral disc degeneration, lumbar region: Secondary | ICD-10-CM | POA: Diagnosis not present

## 2019-07-18 DIAGNOSIS — R030 Elevated blood-pressure reading, without diagnosis of hypertension: Secondary | ICD-10-CM | POA: Diagnosis not present

## 2019-07-18 DIAGNOSIS — Z6828 Body mass index (BMI) 28.0-28.9, adult: Secondary | ICD-10-CM | POA: Diagnosis not present

## 2019-07-18 DIAGNOSIS — M545 Low back pain: Secondary | ICD-10-CM | POA: Diagnosis not present

## 2019-07-18 DIAGNOSIS — M5416 Radiculopathy, lumbar region: Secondary | ICD-10-CM | POA: Diagnosis not present

## 2019-11-26 IMAGING — MR MR [PERSON_NAME]*[PERSON_NAME]* W/O CM
6 series · 40 of 40 positions shown · non-contrast
Comparison: None.

CLINICAL DATA: Fall 1 month ago with continued left thumb swelling
and discoloration. Reduced range of motion.

EXAM:
MRI OF THE LEFT HAND WITHOUT CONTRAST
TECHNIQUE: Multiplanar, multisequence MR imaging of the left thumb was
performed. No intravenous contrast was administered.

[Series 4: T1 · axial · 4.0mm · 0.29mm/px · z∈[-85,+25]mm · 12 of 25 slices shown]
[im 1/25]
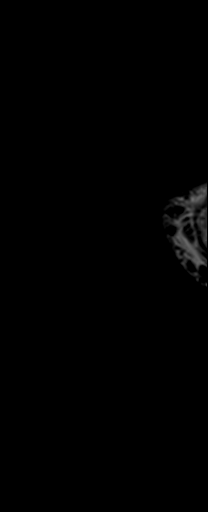
[im 3/25]
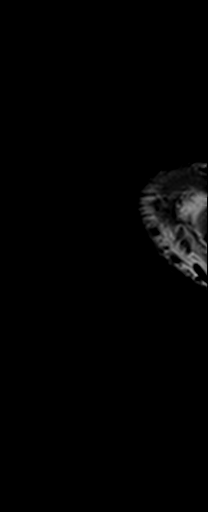
[im 5/25]
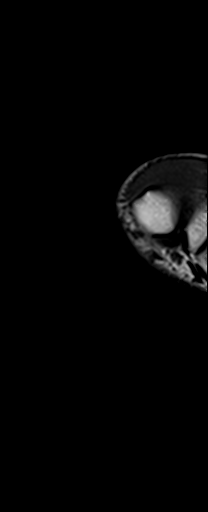
[im 7/25]
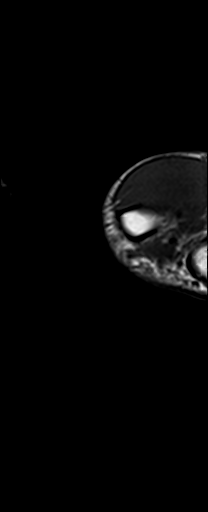
[im 9/25]
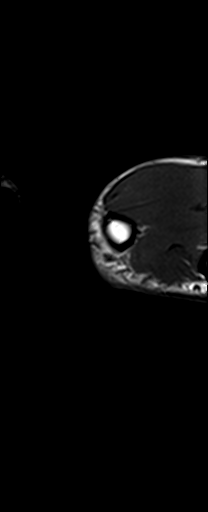
[im 11/25]
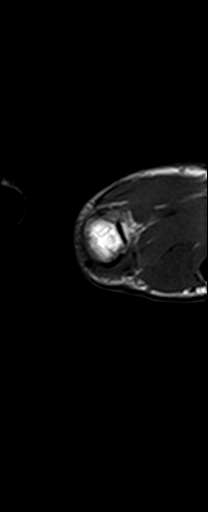
[im 14/25]
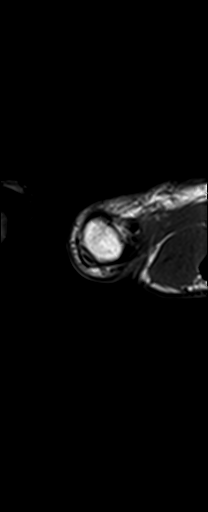
[im 16/25]
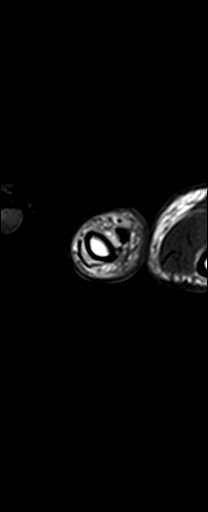
[im 18/25]
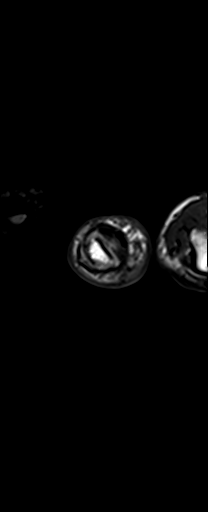
[im 20/25]
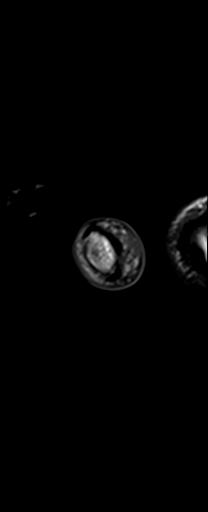
[im 22/25]
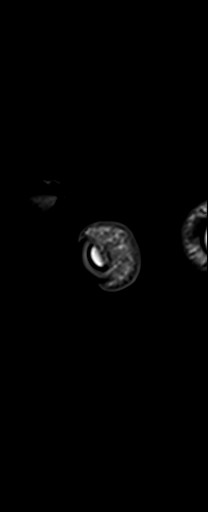
[im 25/25]
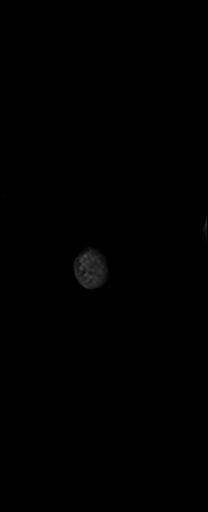

[Series 5: T2 fat-sat · axial · 4.0mm · 0.29mm/px · z∈[-85,+25]mm · 12 of 25 slices shown (1 of 3)]
[im 1/25]
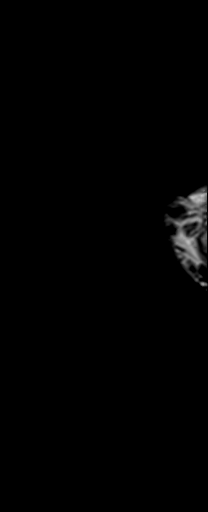
[im 3/25]
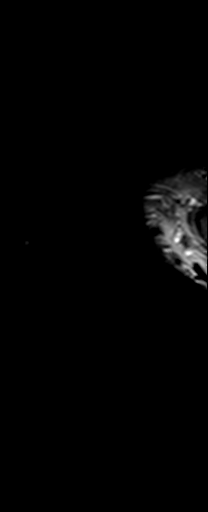
[im 5/25]
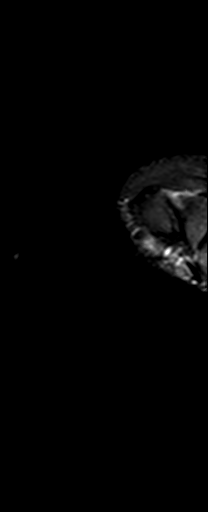
[im 7/25]
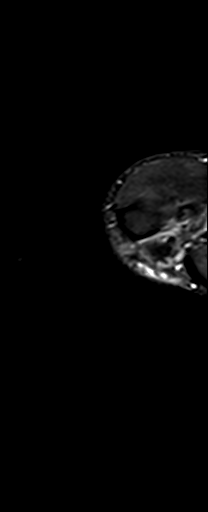
[im 9/25]
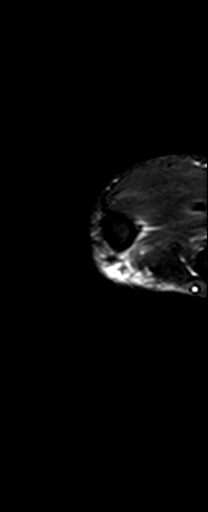
[im 11/25]
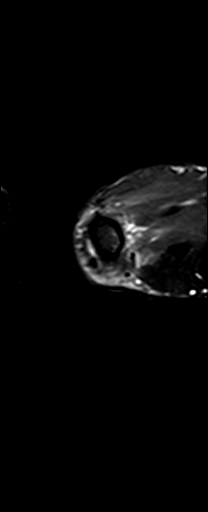
[im 14/25]
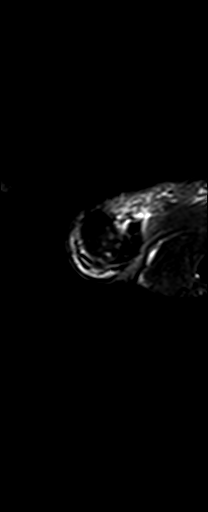
[im 16/25]
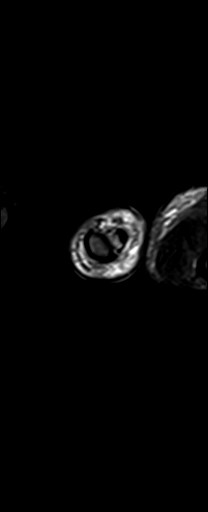
[im 18/25]
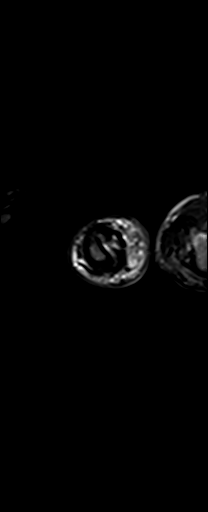
[im 20/25]
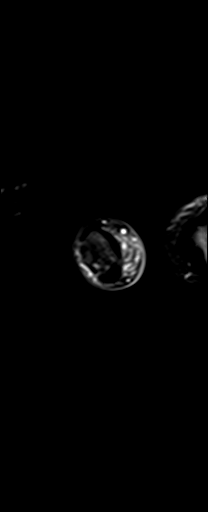
[im 22/25]
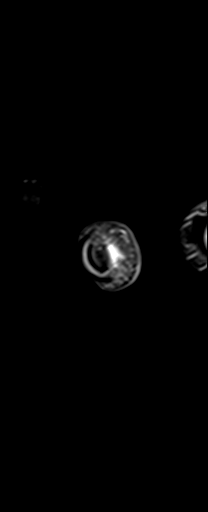
[im 25/25]
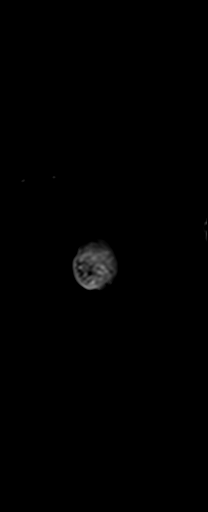

[Series 6: T2 fat-sat · oblique · 3.0mm · 0.29mm/px · 4 of 10 slices shown (2 of 3)]
[im 1/10]
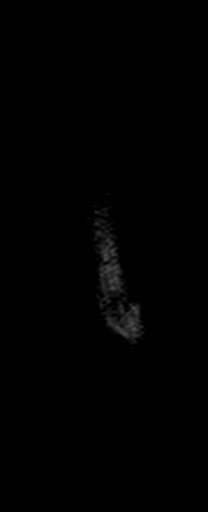
[im 4/10]
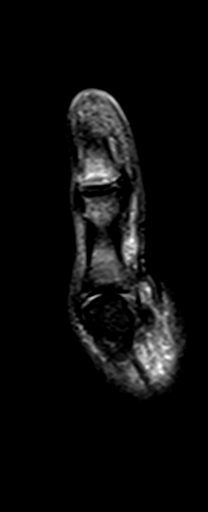
[im 7/10]
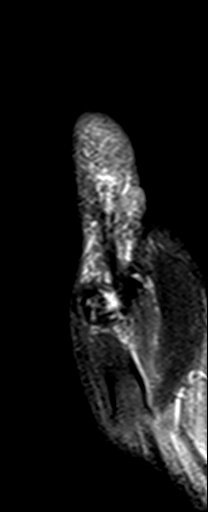
[im 10/10]
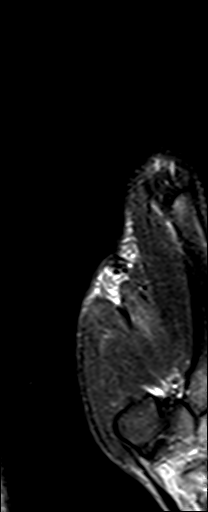

[Series 7: T2 fat-sat · oblique · 3.0mm · 0.29mm/px · 4 of 10 slices shown (3 of 3)]
[im 1/10]
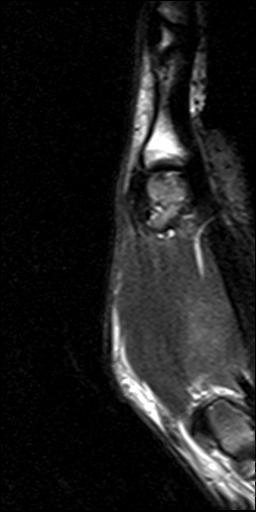
[im 4/10]
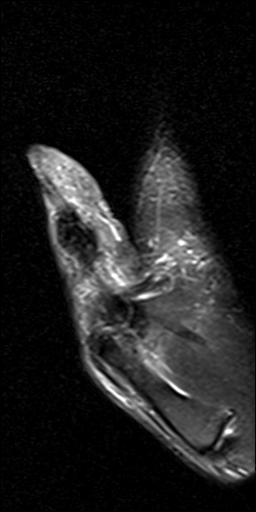
[im 7/10]
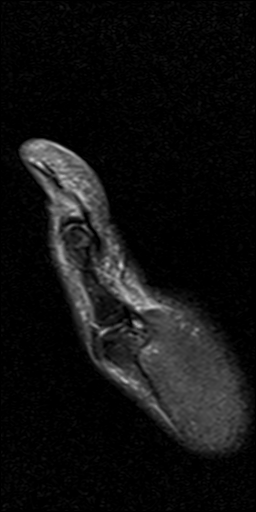
[im 10/10]
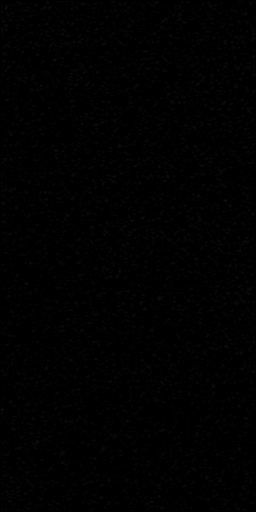

[Series 8: PD fat-sat · oblique · 3.0mm · 0.29mm/px · 4 of 10 slices shown (1 of 2)]
[im 1/10]
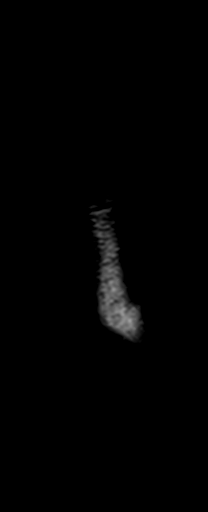
[im 4/10]
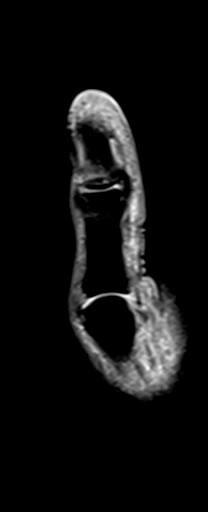
[im 7/10]
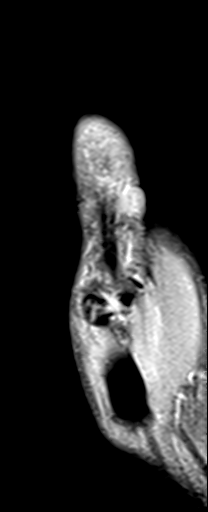
[im 10/10]
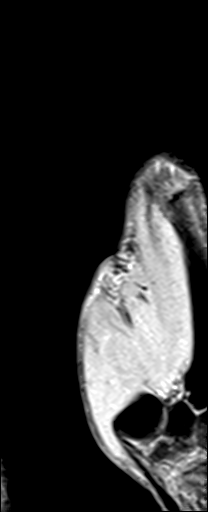

[Series 9: PD fat-sat · oblique · 3.0mm · 0.29mm/px · 4 of 10 slices shown (2 of 2)]
[im 1/10]
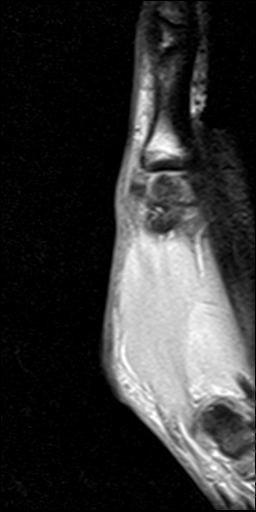
[im 4/10]
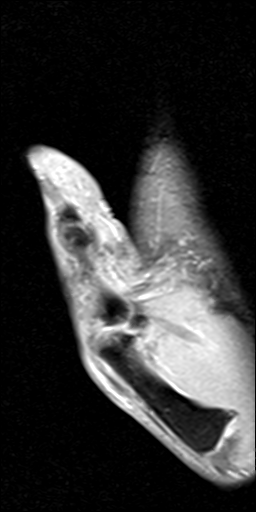
[im 7/10]
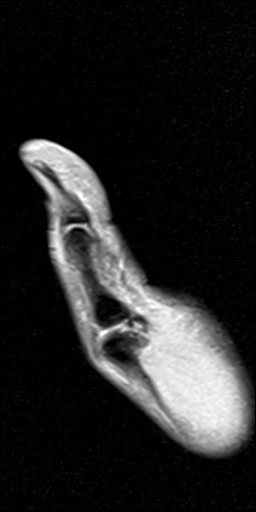
[im 10/10]
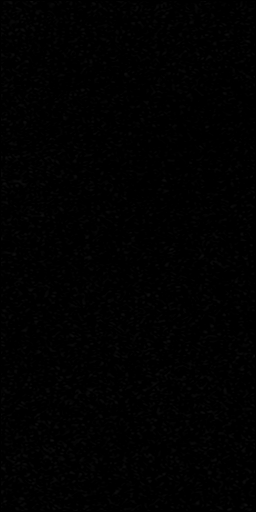

[40 of 40 positions shown; findings below may reference images not displayed]

FINDINGS: Bones/Joint/Cartilage

No fracture or dislocation identified. There is some mild
degenerative spurring at the first carpometacarpal articulation.

Ligaments

Torn proximal ulnar collateral ligament with suspicion for slippage
of the torn and of the ulnar collateral ligament superficial to the
adductor aponeurosis for example on images 4-5 series 6, compatible
with a Stener lesion.

Muscles and Tendons

Low-level edema in the distal adductor pollicis muscle.

Soft tissues

Mild regional soft tissue swelling in the thumb.
IMPRESSION: 1. Torn proximal ulnar collateral ligament with Stener lesion
(adductor aponeurosis slipped deep to the UCL). Adjacent low-level
edema in the distal adductor pollicis muscle. Regional soft tissue
swelling noted.
